# Patient Record
Sex: Male | Born: 1975 | Race: White | Hispanic: No | Marital: Married | Smoking: Never smoker
Health system: Southern US, Community
[De-identification: ages and names within clinical notes are randomized; demographics above are authoritative.]

## PROBLEM LIST (undated history)

## (undated) DIAGNOSIS — N200 Calculus of kidney: Secondary | ICD-10-CM

---

## 2003-12-15 ENCOUNTER — Emergency Department (HOSPITAL_COMMUNITY): Admission: EM | Admit: 2003-12-15 | Discharge: 2003-12-15 | Payer: Self-pay | Admitting: Emergency Medicine

## 2007-12-07 ENCOUNTER — Encounter: Admission: RE | Admit: 2007-12-07 | Discharge: 2007-12-07 | Payer: Self-pay | Admitting: Family Medicine

## 2008-04-10 IMAGING — CT CT HEAD W/O CM
1 series · 16 of 30 positions shown, 20 images · non-contrast
Comparison: none

CLINICAL DATA: Headaches

[Series 32: 3d filtered head · axial · 0.49mm/px · z∈[-9,+134]mm · 16 of 32 slices shown, 20 images]
[im 2/32  brain]
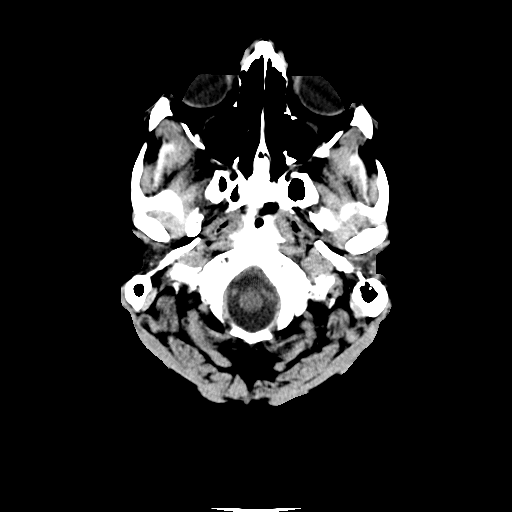
[im 2/32  bone]
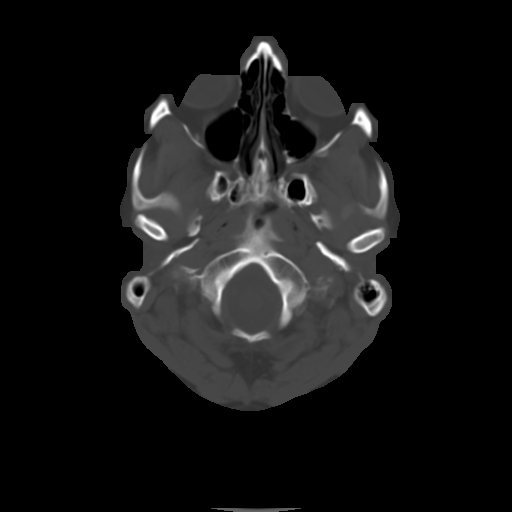
[im 4/32  brain]
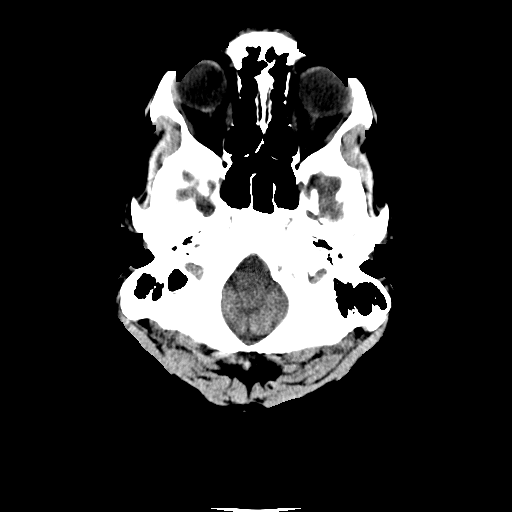
[im 6/32  brain]
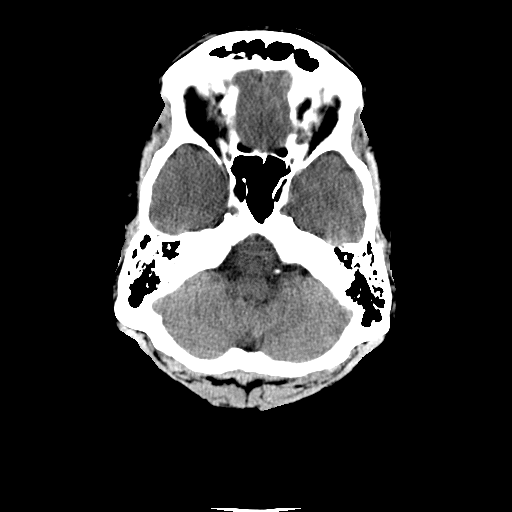
[im 8/32  brain]
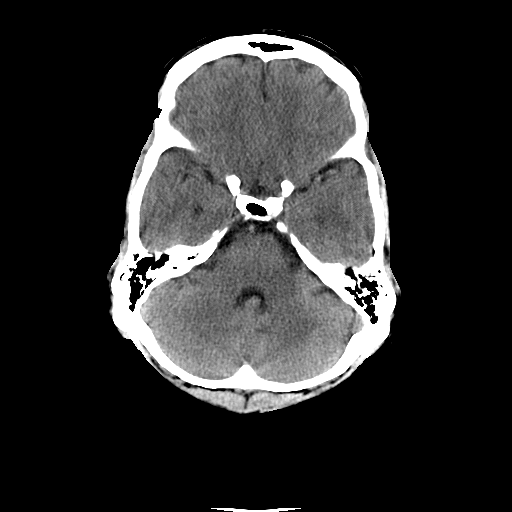
[im 9/32  brain]
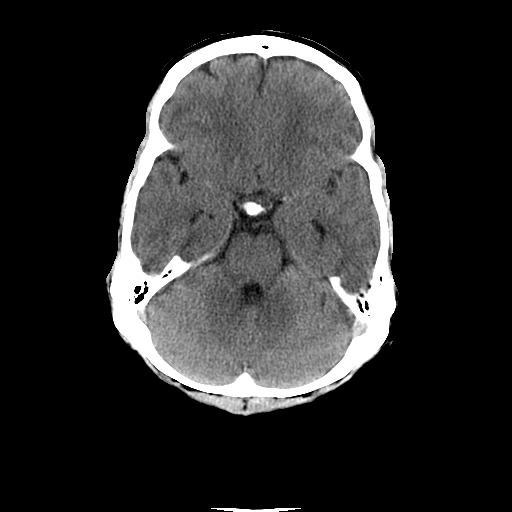
[im 9/32  bone]
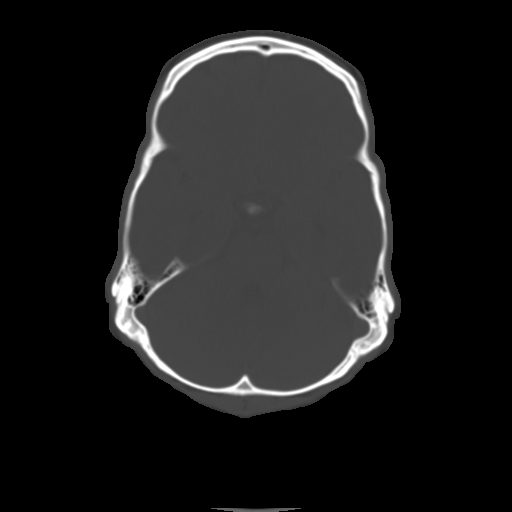
[im 11/32  brain]
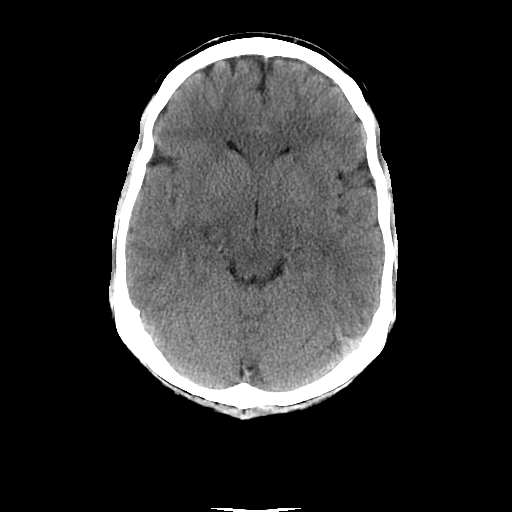
[im 13/32  brain]
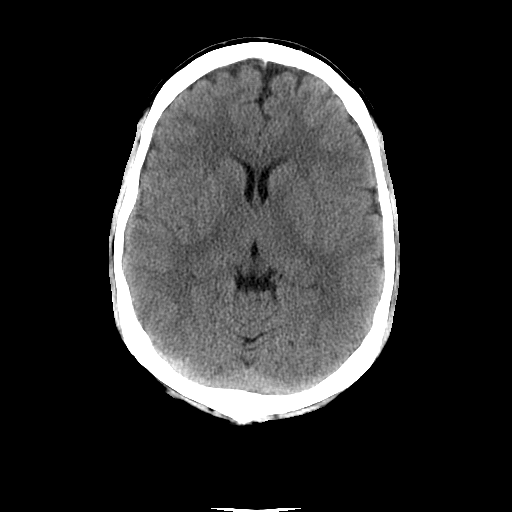
[im 15/32  brain]
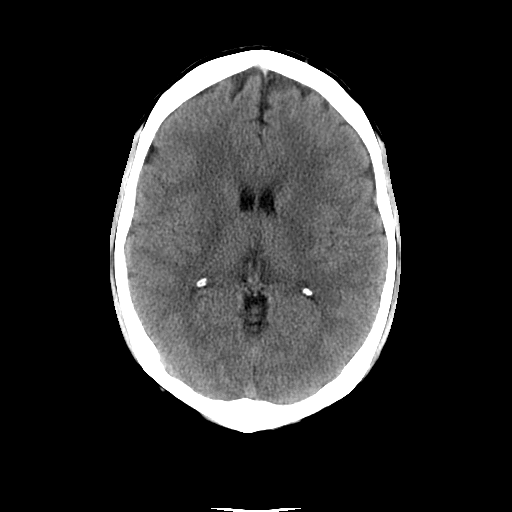
[im 17/32  brain]
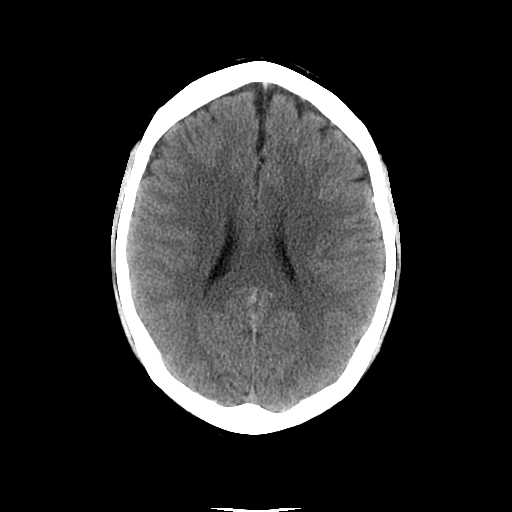
[im 17/32  bone]
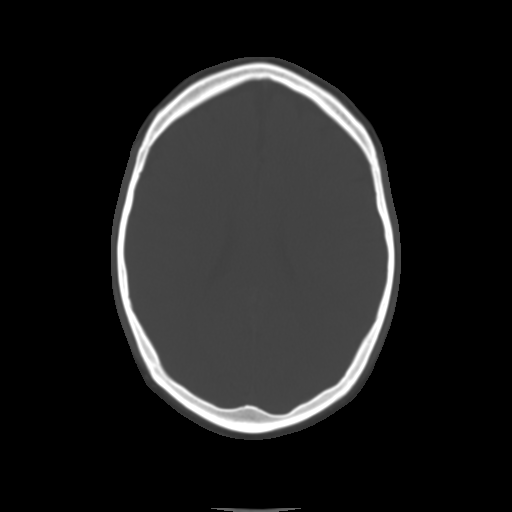
[im 19/32  brain]
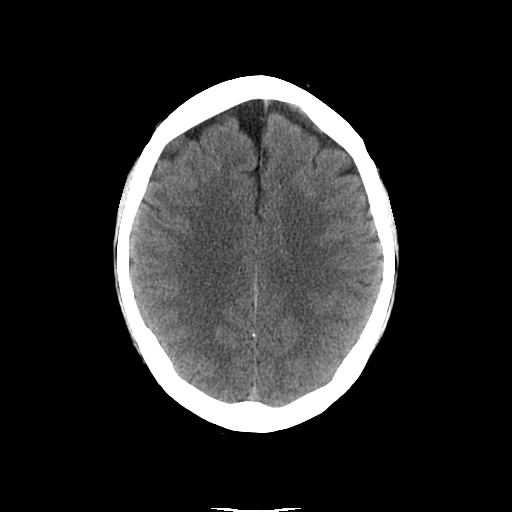
[im 21/32  brain]
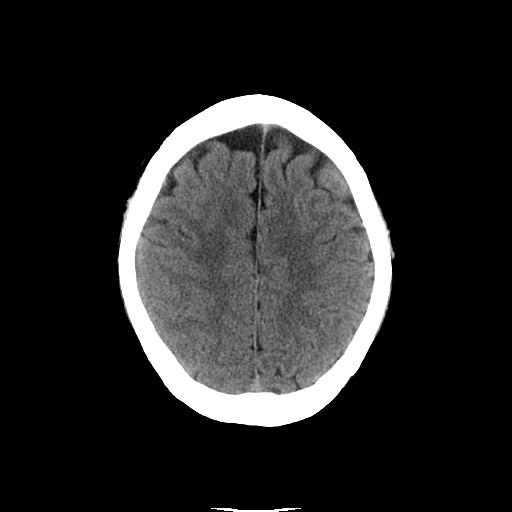
[im 23/32  brain]
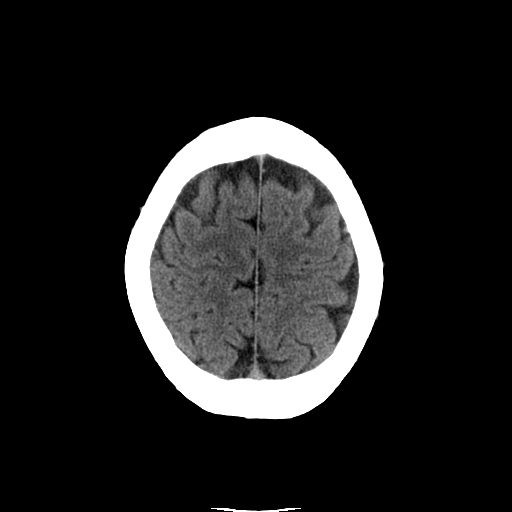
[im 24/32  brain]
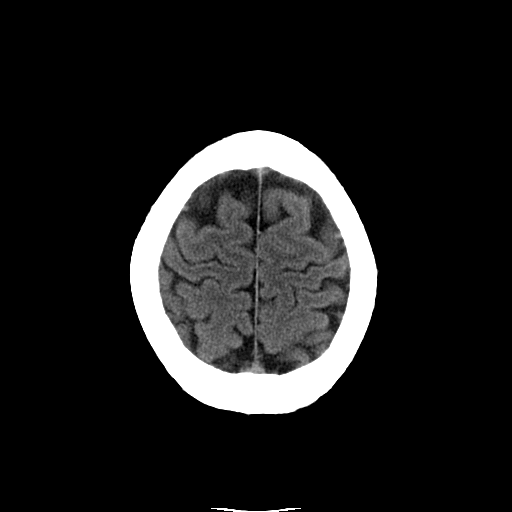
[im 24/32  bone]
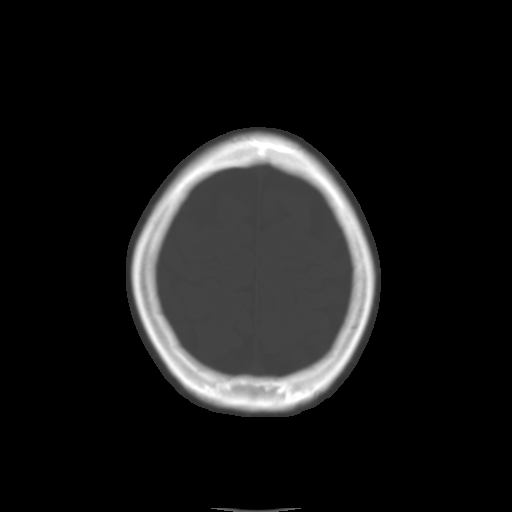
[im 26/32  brain]
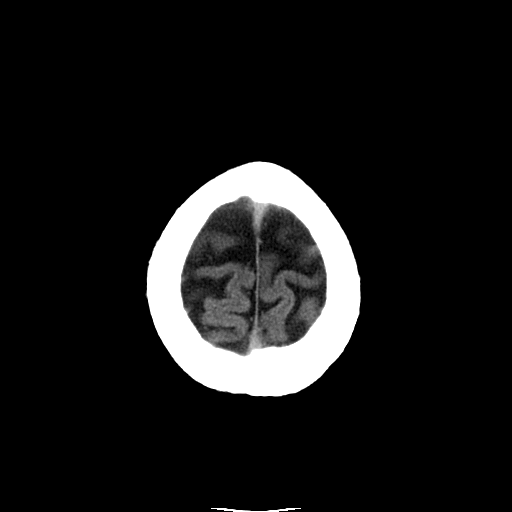
[im 28/32  brain]
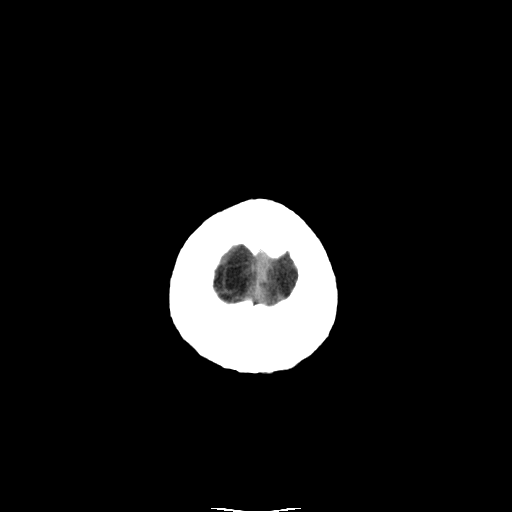
[im 30/32  brain]
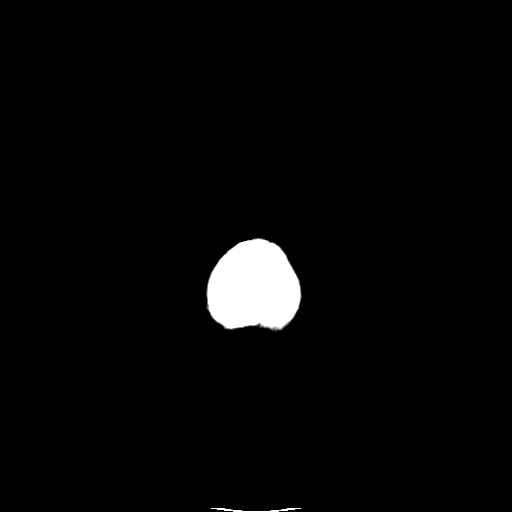

[16 of 30 positions shown; findings below may reference images not displayed]

CT head without contrast:

No previous for comparison.  There is no evidence of acute intracranial
hemorrhage, brain edema, mass,  mass effect, or midline shift. Acute infarct may
be inapparent on noncontrast CT.  No other intra-axial abnormalities are seen,
and the ventricles and sulci are within normal limits in size and symmetry.   No
abnormal extra-axial fluid collections or masses are identified.  No significant
calvarial abnormality.
IMPRESSION: 1. Negative non-contrast head CT.

## 2012-03-16 DIAGNOSIS — M549 Dorsalgia, unspecified: Secondary | ICD-10-CM | POA: Insufficient documentation

## 2013-02-07 ENCOUNTER — Telehealth: Payer: Self-pay | Admitting: Nurse Practitioner

## 2013-02-07 NOTE — Telephone Encounter (Signed)
appt made

## 2013-02-08 ENCOUNTER — Ambulatory Visit: Payer: Self-pay | Admitting: Family Medicine

## 2015-02-20 DIAGNOSIS — K2100 Gastro-esophageal reflux disease with esophagitis, without bleeding: Secondary | ICD-10-CM | POA: Insufficient documentation

## 2015-08-05 ENCOUNTER — Emergency Department (HOSPITAL_BASED_OUTPATIENT_CLINIC_OR_DEPARTMENT_OTHER)
Admission: EM | Admit: 2015-08-05 | Discharge: 2015-08-05 | Disposition: A | Discharge status: Home / Self Care | Payer: BLUE CROSS/BLUE SHIELD | Attending: Emergency Medicine | Admitting: Emergency Medicine

## 2015-08-05 ENCOUNTER — Encounter (HOSPITAL_BASED_OUTPATIENT_CLINIC_OR_DEPARTMENT_OTHER): Payer: Self-pay

## 2015-08-05 DIAGNOSIS — R109 Unspecified abdominal pain: Secondary | ICD-10-CM

## 2015-08-05 DIAGNOSIS — N200 Calculus of kidney: Secondary | ICD-10-CM | POA: Diagnosis not present

## 2015-08-05 DIAGNOSIS — R319 Hematuria, unspecified: Secondary | ICD-10-CM

## 2015-08-05 HISTORY — DX: Calculus of kidney: N20.0

## 2015-08-05 LAB — URINE MICROSCOPIC-ADD ON

## 2015-08-05 LAB — URINALYSIS, ROUTINE W REFLEX MICROSCOPIC
Bilirubin Urine: NEGATIVE
GLUCOSE, UA: NEGATIVE mg/dL
KETONES UR: 15 mg/dL — AB
Nitrite: NEGATIVE
PROTEIN: NEGATIVE mg/dL
Specific Gravity, Urine: 1.016 (ref 1.005–1.030)
Urobilinogen, UA: 1 mg/dL (ref 0.0–1.0)
pH: 6 (ref 5.0–8.0)

## 2015-08-05 MED ORDER — TAMSULOSIN HCL 0.4 MG PO CAPS: 0.4000 mg | ORAL_CAPSULE | Freq: Every day | ORAL | Status: DC

## 2015-08-05 MED ORDER — ONDANSETRON 4 MG PO TBDP: ORAL_TABLET | ORAL | Status: DC

## 2015-08-05 MED ORDER — ONDANSETRON 4 MG PO TBDP
4.0000 mg | ORAL_TABLET | Freq: Once | ORAL | Status: AC
  Administered 2015-08-05: 4 mg via ORAL
  Filled 2015-08-05: qty 1

## 2015-08-05 MED ORDER — OXYCODONE-ACETAMINOPHEN 5-325 MG PO TABS: 1.0000 | ORAL_TABLET | Freq: Four times a day (QID) | ORAL | Status: DC | PRN

## 2015-08-05 MED ORDER — HYDROMORPHONE HCL 1 MG/ML IJ SOLN
1.0000 mg | Freq: Once | INTRAMUSCULAR | Status: AC
  Administered 2015-08-05: 1 mg via INTRAMUSCULAR
  Filled 2015-08-05: qty 1

## 2015-08-05 NOTE — Discharge Instructions (Signed)
Take percocet for severe pain only. No driving or operating heavy machinery while taking percocet. This medication may cause drowsiness. Take Zofran as directed as needed for nausea. Take flomax as prescribed.  Flank Pain Flank pain refers to pain that is located on the side of the body between the upper abdomen and the back. The pain may occur over a short period of time (acute) or may be long-term or reoccurring (chronic). It may be mild or severe. Flank pain can be caused by many things. CAUSES  Some of the more common causes of flank pain include:  Muscle strains.   Muscle spasms.   A disease of your spine (vertebral disk disease).   A lung infection (pneumonia).   Fluid around your lungs (pulmonary edema).   A kidney infection.   Kidney stones.   A very painful skin rash caused by the chickenpox virus (shingles).   Gallbladder disease.  HOME CARE INSTRUCTIONS  Home care will depend on the cause of your pain. In general,  Rest as directed by your caregiver.  Drink enough fluids to keep your urine clear or pale yellow.  Only take over-the-counter or prescription medicines as directed by your caregiver. Some medicines may help relieve the pain.  Tell your caregiver about any changes in your pain.  Follow up with your caregiver as directed. SEEK IMMEDIATE MEDICAL CARE IF:   Your pain is not controlled with medicine.   You have new or worsening symptoms.  Your pain increases.   You have abdominal pain.   You have shortness of breath.   You have persistent nausea or vomiting.   You have swelling in your abdomen.   You feel faint or pass out.   You have blood in your urine.  You have a fever or persistent symptoms for more than 2-3 days.  You have a fever and your symptoms suddenly get worse. MAKE SURE YOU:   Understand these instructions.  Will watch your condition.  Will get help right away if you are not doing well or get worse.     This information is not intended to replace advice given to you by your health care provider. Make sure you discuss any questions you have with your health care provider.   Document Released: 11/25/2005 Document Revised: 06/28/2012 Document Reviewed: 05/18/2012 Elsevier Interactive Patient Education 2016 Else[employer]  Kidney Stones Kidney stones (urolithiasis) are deposits that form inside your kidneys. The intense pain is caused by the stone moving through the urinary tract. When the stone moves, the ureter goes into spasm around the stone. The stone is usually passed in the urine.  CAUSES   A disorder that makes certain neck glands produce too much parathyroid hormone (primary hyperparathyroidism).  A buildup of uric acid crystals, similar to gout in your joints.  Narrowing (stricture) of the ureter.  A kidney obstruction present at birth (congenital obstruction).  Previous surgery on the kidney or ureters.  Numerous kidney infections. SYMPTOMS   Feeling sick to your stomach (nauseous).  Throwing up (vomiting).  Blood in the urine (hematuria).  Pain that usually spreads (radiates) to the groin.  Frequency or urgency of urination. DIAGNOSIS   Taking a history and physical exam.  Blood or urine tests.  CT scan.  Occasionally, an examination of the inside of the urinary bladder (cystoscopy) is performed. TREATMENT   Observation.  Increasing your fluid intake.  Extracorporeal shock wave lithotripsy--This is a noninvasive procedure that uses shock waves to break up kidney stones.  Surgery may be needed if you have severe pain or persistent obstruction. There are various surgical procedures. Most of the procedures are performed with the use of small instruments. Only small incisions are needed to accommodate these instruments, so recovery time is minimized. The size, location, and chemical composition are all important variables that will determine the proper choice  of action for you. Talk to your health care provider to better understand your situation so that you will minimize the risk of injury to yourself and your kidney.  HOME CARE INSTRUCTIONS   Drink enough water and fluids to keep your urine clear or pale yellow. This will help you to pass the stone or stone fragments.  Strain all urine through the provided strainer. Keep all particulate matter and stones for your health care provider to see. The stone causing the pain may be as small as a grain of salt. It is very important to use the strainer each and every time you pass your urine. The collection of your stone will allow your health care provider to analyze it and verify that a stone has actually passed. The stone analysis will often identify what you can do to reduce the incidence of recurrences.  Only take over-the-counter or prescription medicines for pain, discomfort, or fever as directed by your health care provider.  Keep all follow-up visits as told by your health care provider. This is important.  Get follow-up X-rays if required. The absence of pain does not always mean that the stone has passed. It may have only stopped moving. If the urine remains completely obstructed, it can cause loss of kidney function or even complete destruction of the kidney. It is your responsibility to make sure X-rays and follow-ups are completed. Ultrasounds of the kidney can show blockages and the status of the kidney. Ultrasounds are not associated with any radiation and can be performed easily in a matter of minutes.  Make changes to your daily diet as told by your health care provider. You may be told to:  Limit the amount of salt that you eat.  Eat 5 or more servings of fruits and vegetables each day.  Limit the amount of meat, poultry, fish, and eggs that you eat.  Collect a 24-hour urine sample as told by your health care provider.You may need to collect another urine sample every 6-12 months. SEEK  MEDICAL CARE IF:  You experience pain that is progressive and unresponsive to any pain medicine you have been prescribed. SEEK IMMEDIATE MEDICAL CARE IF:   Pain cannot be controlled with the prescribed medicine.  You have a fever or shaking chills.  The severity or intensity of pain increases over 18 hours and is not relieved by pain medicine.  You develop a new onset of abdominal pain.  You feel faint or pass out.  You are unable to urinate.   This information is not intended to replace advice given to you by your health care provider. Make sure you discuss any questions you have with your health care provider.   Document Released: 10/04/2005 Document Revised: 06/25/2015 Document Reviewed: 03/07/2013 Elsevier Interactive Patient Education [employer]

## 2015-08-05 NOTE — ED Notes (Signed)
Small black object noted in urine cup.

## 2015-08-05 NOTE — ED Provider Notes (Signed)
CSN: [id number]     Arrival date & time 08/05/15  1230 History   First MD Initiated Contact with Patient 08/05/15 1240     Chief Complaint  Patient presents with  . Flank Pain     (Consider location/radiation/quality/duration/timing/severity/associated sxs/prior Treatment) HPI Comments: 39 year old male complaining of sudden onset left flank pain beginning at 3:30 AM that woke him up from sleep. Pain has been waxing and waning since, currently rated 7/10. Pain radiates from the left side of his back around to his flank area. He felt a kidney stone pass today but believes there is another one in there. Reports hematuria. Denies difficulty urinating, increased urinary frequency, urgency or dysuria. Denies fever or chills. Admits to nausea and vomiting. History of a kidney stone 11 years ago. No recent dietary changes. Also requesting his R ear checked. It has felt full over the past week with ringing.  Patient is a 39 y.o. male presenting with flank pain. The history is provided by the patient and the spouse.  Flank Pain This is a new problem. The current episode started today. The problem occurs constantly. The problem has been waxing and waning. Associated symptoms include nausea and vomiting. Nothing aggravates the symptoms. He has tried nothing for the symptoms.    Past Medical History  Diagnosis Date  . Kidney stone    History reviewed. No pertinent past surgical history. No family history on file. Social History  Substance Use Topics  . Smoking status: Never Smoker   . Smokeless tobacco: None  . Alcohol Use: No    Review of Systems  Gastrointestinal: Positive for nausea and vomiting.  Genitourinary: Positive for hematuria and flank pain.  All other systems reviewed and are negative.     Allergies  Review of patient's allergies indicates no known allergies.  Home Medications   Prior to Admission medications   Medication Sig Start Date End Date Taking? Authorizing  Provider  Omeprazole (PRILOSEC PO) Take by mouth.   Yes Historical Provider, MD  ondansetron (ZOFRAN ODT) 4 MG disintegrating tablet 4mg  ODT q4 hours prn nausea/vomit 08/05/15   Michiel Sivley M Yarelli Decelles, PA-C  oxyCODONE-acetaminophen (PERCOCET) 5-325 MG tablet Take 1-2 tablets by mouth every 6 (six) hours as needed for severe pain. 08/05/15   Kathrynn Speedobyn M Maddax Palinkas, PA-C  tamsulosin (FLOMAX) 0.4 MG CAPS capsule Take 1 capsule (0.4 mg total) by mouth daily. 08/05/15   Aryona Sill M Courtnei Ruddell, PA-C   BP 122/50 mmHg  Pulse 67  Temp(Src) 97.6 F (36.4 C) (Oral)  Resp 20  Ht 5\' 9"  (1.753 m)  Wt 185 lb (83.915 kg)  BMI 27.31 kg/m2  SpO2 100% Physical Exam  Constitutional: He is oriented to person, place, and time. He appears well-developed and well-nourished. No distress.  Uncomfortable but in no acute distress.  HENT:  Head: Normocephalic and atraumatic.  Right Ear: Tympanic membrane, external ear and ear canal normal.  Left Ear: Tympanic membrane, external ear and ear canal normal.  Eyes: Conjunctivae and EOM are normal. No scleral icterus.  Neck: Normal range of motion. Neck supple.  Cardiovascular: Normal rate, regular rhythm and normal heart sounds.   Pulmonary/Chest: Effort normal and breath sounds normal.  Abdominal: Normal appearance. He exhibits no distension. There is no tenderness. There is no CVA tenderness.  Musculoskeletal: Normal range of motion. He exhibits no edema.  Neurological: He is alert and oriented to person, place, and time.  Skin: Skin is warm and dry.  Psychiatric: He has a normal mood and affect. His  behavior is normal.  Nursing note and vitals reviewed.   ED Course  Procedures (including critical care time) Labs Review Labs Reviewed  URINALYSIS, ROUTINE W REFLEX MICROSCOPIC (NOT AT [facility]) - Abnormal; Notable for the following:    Color, Urine AMBER (*)    APPearance CLOUDY (*)    Hgb urine dipstick LARGE (*)    Ketones, ur 15 (*)    Leukocytes, UA TRACE (*)    All other components  within normal limits  URINE MICROSCOPIC-ADD ON - Abnormal; Notable for the following:    Squamous Epithelial / LPF FEW (*)    All other components within normal limits  URINE CULTURE    Imaging Review No results found. I have personally reviewed and evaluated these images and lab results as part of my medical decision-making.   EKG Interpretation None      MDM   Final diagnoses:  Left flank pain  Kidney stone on left side  Hematuria   Nontoxic appearing, uncomfortable but in no acute distress. AFVSS. History of kidney stones and this feels similar. UA significant for blood in urine without associated infection. Culture pending. This is most likely from the kidney stone he passed earlier today. Pain and nausea significantly improved in the ED. Discussed CT scan with patient, and I do not feel this is necessary given that he is having no difficulty urinating. Patient agreeable and would like to defer any imaging at this time. Will discharge home with Percocet, Zofran and Flomax and have him follow-up with urology. R ear normal. Stable for discharge. Return precautions given. Pt/family/caregiver aware medical decision making process and agreeable with plan.  Kathrynn Speed, PA-C 08/05/15 1343  Kathrynn Speed, PA-C 08/05/15 1406  Kathrynn Speed, PA-C 08/05/15 1407  Gwyneth Sprout, MD 08/06/15 1534

## 2015-08-05 NOTE — ED Notes (Signed)
Left flank pain x today-blood tinged blood this am

## 2015-08-06 LAB — URINE CULTURE: CULTURE: NO GROWTH

## 2016-03-22 DIAGNOSIS — Z8042 Family history of malignant neoplasm of prostate: Secondary | ICD-10-CM | POA: Insufficient documentation

## 2016-03-22 DIAGNOSIS — Z87442 Personal history of urinary calculi: Secondary | ICD-10-CM | POA: Insufficient documentation

## 2017-09-06 DIAGNOSIS — R400 Somnolence: Secondary | ICD-10-CM | POA: Insufficient documentation

## 2018-03-06 DIAGNOSIS — R748 Abnormal levels of other serum enzymes: Secondary | ICD-10-CM | POA: Insufficient documentation

## 2018-09-08 DIAGNOSIS — F419 Anxiety disorder, unspecified: Secondary | ICD-10-CM | POA: Insufficient documentation

## 2019-04-12 DIAGNOSIS — Z20828 Contact with and (suspected) exposure to other viral communicable diseases: Secondary | ICD-10-CM | POA: Diagnosis not present

## 2019-09-01 DIAGNOSIS — Z20828 Contact with and (suspected) exposure to other viral communicable diseases: Secondary | ICD-10-CM | POA: Diagnosis not present

## 2019-09-01 DIAGNOSIS — R0981 Nasal congestion: Secondary | ICD-10-CM | POA: Diagnosis not present

## 2019-09-12 DIAGNOSIS — K2100 Gastro-esophageal reflux disease with esophagitis, without bleeding: Secondary | ICD-10-CM | POA: Diagnosis not present

## 2019-09-12 DIAGNOSIS — R9431 Abnormal electrocardiogram [ECG] [EKG]: Secondary | ICD-10-CM | POA: Diagnosis not present

## 2019-09-12 DIAGNOSIS — F419 Anxiety disorder, unspecified: Secondary | ICD-10-CM | POA: Diagnosis not present

## 2019-09-12 DIAGNOSIS — Z23 Encounter for immunization: Secondary | ICD-10-CM | POA: Diagnosis not present

## 2019-09-12 DIAGNOSIS — R001 Bradycardia, unspecified: Secondary | ICD-10-CM | POA: Diagnosis not present

## 2019-09-12 DIAGNOSIS — Z0000 Encounter for general adult medical examination without abnormal findings: Secondary | ICD-10-CM | POA: Diagnosis not present

## 2019-09-12 DIAGNOSIS — E785 Hyperlipidemia, unspecified: Secondary | ICD-10-CM | POA: Diagnosis not present

## 2020-03-04 DIAGNOSIS — H00022 Hordeolum internum right lower eyelid: Secondary | ICD-10-CM | POA: Diagnosis not present

## 2020-08-26 ENCOUNTER — Encounter: Payer: Self-pay | Admitting: Nurse Practitioner

## 2020-08-26 ENCOUNTER — Other Ambulatory Visit: Payer: Self-pay

## 2020-08-26 ENCOUNTER — Ambulatory Visit (INDEPENDENT_AMBULATORY_CARE_PROVIDER_SITE_OTHER): Payer: BC Managed Care – PPO | Admitting: Nurse Practitioner

## 2020-08-26 VITALS — BP 106/70 | HR 46 | Temp 97.4°F | Ht 69.0 in | Wt 174.0 lb

## 2020-08-26 DIAGNOSIS — Z0000 Encounter for general adult medical examination without abnormal findings: Secondary | ICD-10-CM | POA: Diagnosis not present

## 2020-08-26 DIAGNOSIS — E785 Hyperlipidemia, unspecified: Secondary | ICD-10-CM | POA: Diagnosis not present

## 2020-08-26 LAB — CBC WITH DIFFERENTIAL/PLATELET
Basophils Absolute: 0 10*3/uL (ref 0.0–0.2)
Basos: 1 %
EOS (ABSOLUTE): 0.1 10*3/uL (ref 0.0–0.4)
Eos: 1 %
Hematocrit: 47.8 % (ref 37.5–51.0)
Hemoglobin: 16.3 g/dL (ref 13.0–17.7)
Immature Grans (Abs): 0 10*3/uL (ref 0.0–0.1)
Immature Granulocytes: 0 %
Lymphocytes Absolute: 1.4 10*3/uL (ref 0.7–3.1)
Lymphs: 28 %
MCH: 30.2 pg (ref 26.6–33.0)
MCHC: 34.1 g/dL (ref 31.5–35.7)
MCV: 89 fL (ref 79–97)
Monocytes Absolute: 0.4 10*3/uL (ref 0.1–0.9)
Monocytes: 8 %
Neutrophils Absolute: 3.2 10*3/uL (ref 1.4–7.0)
Neutrophils: 62 %
Platelets: 301 10*3/uL (ref 150–450)
RBC: 5.39 x10E6/uL (ref 4.14–5.80)
RDW: 12.4 % (ref 11.6–15.4)
WBC: 5.1 10*3/uL (ref 3.4–10.8)

## 2020-08-26 LAB — COMPREHENSIVE METABOLIC PANEL
ALT: 23 IU/L (ref 0–44)
AST: 21 IU/L (ref 0–40)
Albumin/Globulin Ratio: 2.3 — ABNORMAL HIGH (ref 1.2–2.2)
Albumin: 4.8 g/dL (ref 4.0–5.0)
Alkaline Phosphatase: 88 IU/L (ref 44–121)
BUN/Creatinine Ratio: 11 (ref 9–20)
BUN: 12 mg/dL (ref 6–24)
Bilirubin Total: 0.9 mg/dL (ref 0.0–1.2)
CO2: 24 mmol/L (ref 20–29)
Calcium: 10.2 mg/dL (ref 8.7–10.2)
Chloride: 105 mmol/L (ref 96–106)
Creatinine, Ser: 1.08 mg/dL (ref 0.76–1.27)
GFR calc Af Amer: 95 mL/min/{1.73_m2} (ref >59)
GFR calc non Af Amer: 82 mL/min/{1.73_m2} (ref >59)
Globulin, Total: 2.1 g/dL (ref 1.5–4.5)
Glucose: 85 mg/dL (ref 65–99)
Potassium: 4.5 mmol/L (ref 3.5–5.2)
Sodium: 133 mmol/L — ABNORMAL LOW (ref 134–144)
Total Protein: 6.9 g/dL (ref 6.0–8.5)

## 2020-08-26 LAB — LIPID PANEL
Chol/HDL Ratio: 3.9 ratio (ref 0.0–5.0)
Cholesterol, Total: 216 mg/dL — ABNORMAL HIGH (ref 100–199)
HDL: 55 mg/dL (ref >39)
LDL Chol Calc (NIH): 147 mg/dL — ABNORMAL HIGH (ref 0–99)
Triglycerides: 77 mg/dL (ref 0–149)
VLDL Cholesterol Cal: 14 mg/dL (ref 5–40)

## 2020-08-26 NOTE — Progress Notes (Signed)
New Patient Note  RE: BRONX BROGDEN MRN: [id number] DOB: 04/08/1976 Date of Office Visit: 08/26/2020  Chief Complaint: Annual Exam  History of Present Illness:    Encounter for general adult medical examination without abnormal findings  Physical: Patient's last physical exam was 1 year ago .  Weight: Appropriate for height (BMI less than 27%) ;  Blood Pressure: Normal (BP less than 120/80) ;  Medical History: Patient history reviewed ; Family history reviewed ;  Allergies Reviewed: No change in current allergies ;  Medications Reviewed: Medications reviewed - no changes ;  Lipids: Normal lipid levels ;  Smoking: Life-long non-smoker ;  Physical Activity: Exercises at least 3 times per week ;  Alcohol/Drug Use: Is a non-drinker ; No illicit drug use ;  Patient is not afflicted from Stress Incontinence and Urge Incontinence  Safety: reviewed ; Patient wears a seat belt, has smoke detectors, has carbon monoxide detectors, practices appropriate gun safety, and wears sunscreen with extended sun exposure. Dental Care: biannual cleanings, brushes and flosses daily. Ophthalmology/Optometry: Annual visit.  Hearing loss: none Vision impairments: none  Assessment and Plan: Corey Rosario is a 44 y.o. male with: No problem-specific Assessment & Plan notes found for this encounter.  Return in about 1 year (around 08/26/2021).   Diagnostics:   Past Medical History: Patient Active Problem List   Diagnosis Date Noted  . Annual physical exam 08/26/2020   Past Medical History:  Diagnosis Date  . Kidney stone    Past Surgical History: History reviewed. No pertinent surgical history. Medication List:  No current outpatient medications on file.   No current facility-administered medications for this visit.   Allergies: No Known Allergies Social History: Social History   Socioeconomic History  . Marital status: Married    Spouse name: Not on file  . Number of children: Not on  file  . Years of education: Not on file  . Highest education level: Not on file  Occupational History  . Not on file  Tobacco Use  . Smoking status: Never Smoker  . Smokeless tobacco: Never Used  Substance and Sexual Activity  . Alcohol use: No  . Drug use: No  . Sexual activity: Not on file  Other Topics Concern  . Not on file  Social History Narrative  . Not on file   Social Determinants of Health   Financial Resource Strain:   . Difficulty of Paying Living Expenses: Not on file  Food Insecurity:   . Worried About Programme researcher, broadcasting/film/video in the Last Year: Not on file  . Ran Out of Food in the Last Year: Not on file  Transportation Needs:   . Lack of Transportation (Medical): Not on file  . Lack of Transportation (Non-Medical): Not on file  Physical Activity:   . Days of Exercise per Week: Not on file  . Minutes of Exercise per Session: Not on file  Stress:   . Feeling of Stress : Not on file  Social Connections:   . Frequency of Communication with Friends and Family: Not on file  . Frequency of Social Gatherings with Friends and Family: Not on file  . Attends Religious Services: Not on file  . Active Member of Clubs or Organizations: Not on file  . Attends Banker Meetings: Not on file  . Marital Status: Not on file       Family History: Family History  Problem Relation Age of Onset  . Hyperlipidemia Mother   . Diabetes Father   .  Hyperlipidemia Father          Review of Systems  Constitutional: Negative.   HENT: Negative.   Eyes: Negative.   Respiratory: Negative.   Cardiovascular: Negative.   Gastrointestinal: Negative.   Genitourinary: Negative.   Musculoskeletal: Negative.   Skin: Negative.   Neurological: Negative.   Psychiatric/Behavioral: Negative.   All other systems reviewed and are negative.  Objective: BP 106/70   Pulse (!) 46   Temp (!) 97.4 F (36.3 C) (Temporal)   Ht 5\' 9"  (1.753 m)   Wt 174 lb (78.9 kg)   SpO2 99%    BMI 25.70 kg/m  Body mass index is 25.7 kg/m. Physical Exam Vitals reviewed.  Constitutional:      Appearance: Normal appearance.  HENT:     Head: Normocephalic.     Right Ear: External ear normal. There is no impacted cerumen.     Left Ear: External ear normal. There is no impacted cerumen.     Nose: Nose normal.     Mouth/Throat:     Mouth: Mucous membranes are moist.     Pharynx: Oropharynx is clear. No oropharyngeal exudate.  Eyes:     Conjunctiva/sclera: Conjunctivae normal.  Cardiovascular:     Rate and Rhythm: Normal rate.     Pulses: Normal pulses.     Heart sounds: Normal heart sounds.  Pulmonary:     Effort: Pulmonary effort is normal.     Breath sounds: Normal breath sounds.  Abdominal:     General: Bowel sounds are normal.  Musculoskeletal:        General: No tenderness. Normal range of motion.     Cervical back: Normal range of motion.  Skin:    General: Skin is warm.  Neurological:     Mental Status: He is alert and oriented to person, place, and time.  Psychiatric:        Mood and Affect: Mood normal.        Behavior: Behavior normal.    The plan was reviewed with the patient/family, and all questions/concerned were addressed.  It was my pleasure to see Corey Rosario today and participate in his care. Please feel free to contact me with any questions or concerns.  Sincerely,  Sharlet Salina NP [facility]

## 2020-08-26 NOTE — Patient Instructions (Signed)

## 2020-08-26 NOTE — Assessment & Plan Note (Signed)
Patient in clinic today for an annual  Physical exam, completed head to toe assessment, patient has no new concerns, provided education to patient for preventative and health maintenance.  Printed handout given.   Follow up in 1 year.

## 2020-11-07 ENCOUNTER — Encounter: Payer: Self-pay | Admitting: Family Medicine

## 2020-11-07 ENCOUNTER — Ambulatory Visit (INDEPENDENT_AMBULATORY_CARE_PROVIDER_SITE_OTHER): Payer: BC Managed Care – PPO | Admitting: Family Medicine

## 2020-11-07 DIAGNOSIS — J019 Acute sinusitis, unspecified: Secondary | ICD-10-CM

## 2020-11-07 MED ORDER — AMOXICILLIN-POT CLAVULANATE 875-125 MG PO TABS: 1.0000 | ORAL_TABLET | Freq: Two times a day (BID) | ORAL | 0 refills | Status: AC

## 2020-11-07 MED ORDER — FLUTICASONE PROPIONATE 50 MCG/ACT NA SUSP: 2.0000 | Freq: Every day | NASAL | 6 refills | Status: AC

## 2020-11-07 MED ORDER — BENZONATATE 100 MG PO CAPS: 100.0000 mg | ORAL_CAPSULE | Freq: Three times a day (TID) | ORAL | 0 refills | Status: AC | PRN

## 2020-11-07 NOTE — Progress Notes (Signed)
   Virtual Visit via telephone Note Due to COVID-19 pandemic this visit was conducted virtually. This visit type was conducted due to national recommendations for restrictions regarding the COVID-19 Pandemic (e.g. social distancing, sheltering in place) in an effort to limit this patient's exposure and mitigate transmission in our community. All issues noted in this document were discussed and addressed.  A physical exam was not performed with this format.  I connected with Corey Rosario on 11/07/20 at 1050 by telephone and verified that I am speaking with the correct person using two identifiers. Corey Rosario is currently located at work and no one is currently with him during the visit. The provider, Gabriel Earing, FNP is located in their office at time of visit.  I discussed the limitations, risks, security and privacy concerns of performing an evaluation and management service by telephone and the availability of in person appointments. I also discussed with the patient that there may be a patient responsible charge related to this service. The patient expressed understanding and agreed to proceed.  CC: congestion  History and Present Illness:  HPI  Corey Rosario reports cough and congestion since testing positive for Covid on 10/22/20. He has taken mucinex, sudafed, and nasal saline without improvement. He reports feeling fine other than the cough and congestion. He reports that his cough and congestion has not improved despite OTC treatments. His cough is now productive a times with yellow mucus. He denies fever, chest pain, wheezing, or shortness of breath.     ROS As per HPI.   Observations/Objective: Alert and oriented x 3. Able to speak in full sentences without difficulty.   Assessment and Plan: Diagnoses and all orders for this visit:  Acute non-recurrent sinusitis, unspecified location Will treat with antibiotics given length of symptoms without improvement in  congestion. Tessalon pearles for cough. Continue nasal saline. Add flonase daily. Push fluids. Return to office for new or worsening symptoms, or if symptoms persist.  -     amoxicillin-clavulanate (AUGMENTIN) 875-125 MG tablet; Take 1 tablet by mouth 2 (two) times daily. -     benzonatate (TESSALON PERLES) 100 MG capsule; Take 1 capsule (100 mg total) by mouth 3 (three) times daily as needed for cough. -     fluticasone (FLONASE) 50 MCG/ACT nasal spray; Place 2 sprays into both nostrils daily.   Follow Up Instructions: As needed.     I discussed the assessment and treatment plan with the patient. The patient was provided an opportunity to ask questions and all were answered. The patient agreed with the plan and demonstrated an understanding of the instructions.   The patient was advised to call back or seek an in-person evaluation if the symptoms worsen or if the condition fails to improve as anticipated.  The above assessment and management plan was discussed with the patient. The patient verbalized understanding of and has agreed to the management plan. Patient is aware to call the clinic if symptoms persist or worsen. Patient is aware when to return to the clinic for a follow-up visit. Patient educated on when it is appropriate to go to the emergency department.   Time call ended:  15   Corey Haynesworth Hughes Better, FNP

## 2020-12-02 DIAGNOSIS — Z1211 Encounter for screening for malignant neoplasm of colon: Secondary | ICD-10-CM | POA: Diagnosis not present

## 2020-12-02 DIAGNOSIS — Z0000 Encounter for general adult medical examination without abnormal findings: Secondary | ICD-10-CM | POA: Diagnosis not present

## 2020-12-02 DIAGNOSIS — M5136 Other intervertebral disc degeneration, lumbar region: Secondary | ICD-10-CM | POA: Diagnosis not present

## 2020-12-02 DIAGNOSIS — E291 Testicular hypofunction: Secondary | ICD-10-CM | POA: Diagnosis not present

## 2020-12-02 DIAGNOSIS — E785 Hyperlipidemia, unspecified: Secondary | ICD-10-CM | POA: Diagnosis not present

## 2020-12-15 DIAGNOSIS — E291 Testicular hypofunction: Secondary | ICD-10-CM | POA: Diagnosis not present

## 2020-12-15 DIAGNOSIS — R41840 Attention and concentration deficit: Secondary | ICD-10-CM | POA: Diagnosis not present

## 2021-04-21 ENCOUNTER — Ambulatory Visit (INDEPENDENT_AMBULATORY_CARE_PROVIDER_SITE_OTHER): Payer: BC Managed Care – PPO | Admitting: Podiatry

## 2021-04-21 ENCOUNTER — Other Ambulatory Visit: Payer: Self-pay

## 2021-04-21 DIAGNOSIS — L603 Nail dystrophy: Secondary | ICD-10-CM | POA: Diagnosis not present

## 2021-04-21 NOTE — Patient Instructions (Signed)
Look on [employer] for a urea cream or gel containing 40% or 50% urea. Something such as:     

## 2021-04-21 NOTE — Progress Notes (Signed)
  Subjective:  Patient ID: SAEED TOREN, male    DOB: 17-Sep-1976,  MRN: [id number]  Chief Complaint  Patient presents with   Nail Problem    B/L discoloration of nails. Right hallux thickened and only grows out to certain point. Has tried OTC fungal/remedies w/ no relief.    45 y.o. male presents with the above complaint. History confirmed with patient.  Objective:  Physical Exam: warm, good capillary refill, no trophic changes or ulcerative lesions, normal DP and PT pulses, and normal sensory exam. Left Foot: hallux nail thickened, dystrophic, lysis distally. 2nd toenail distal lysis Right Foot: distal hallux nail lysis, adhered proximally   Assessment:   1. Nail dystrophy    Plan:  Patient was evaluated and treated and all questions answered.  Onychodystrophy -Educated on etiology -Nails debrided of loose nail back to healthy growing nail. -Recc urea cream/gel OTC. Educated on use. Given instructions on where to get.  Return in about 6 weeks (around 06/02/2021) for Nail dystrophy f/u.

## 2021-05-25 DIAGNOSIS — Z23 Encounter for immunization: Secondary | ICD-10-CM | POA: Diagnosis not present

## 2021-05-25 DIAGNOSIS — T63441A Toxic effect of venom of bees, accidental (unintentional), initial encounter: Secondary | ICD-10-CM | POA: Diagnosis not present

## 2021-06-04 DIAGNOSIS — E785 Hyperlipidemia, unspecified: Secondary | ICD-10-CM | POA: Diagnosis not present

## 2021-06-09 ENCOUNTER — Other Ambulatory Visit: Payer: Self-pay

## 2021-06-09 ENCOUNTER — Ambulatory Visit (INDEPENDENT_AMBULATORY_CARE_PROVIDER_SITE_OTHER): Payer: BC Managed Care – PPO | Admitting: Podiatry

## 2021-06-09 DIAGNOSIS — L603 Nail dystrophy: Secondary | ICD-10-CM | POA: Diagnosis not present

## 2021-06-09 NOTE — Progress Notes (Signed)
  Subjective:  Patient ID: GORJE IYER, male    DOB: 1976-02-17,  MRN: [id number]  Chief Complaint  Patient presents with   Nail Problem    Right hallux nail follow up  PT stated that he is doing well he has no concerns at this time    45 y.o. male presents with the above complaint. History confirmed with patient.  Objective:  Physical Exam: warm, good capillary refill, no trophic changes or ulcerative lesions, normal DP and PT pulses, and normal sensory exam. Left Foot: hallux nail thin normal coloration. Right Foot: nail growing without further lysis Assessment:   1. Nail dystrophy     Plan:  Patient was evaluated and treated and all questions answered.  Onychodystrophy -Left hallux nail improved texture and color. Right hallux nail healing well without further lysis. Mild thickening. Debrided of thickness today. Continue urea cream 2-3x weekly. Should grow out normally within 3-4 months f/u if issues persist.  No follow-ups on file.

## 2021-08-10 DIAGNOSIS — H00021 Hordeolum internum right upper eyelid: Secondary | ICD-10-CM | POA: Diagnosis not present

## 2021-09-11 DIAGNOSIS — Z03818 Encounter for observation for suspected exposure to other biological agents ruled out: Secondary | ICD-10-CM | POA: Diagnosis not present

## 2021-09-11 DIAGNOSIS — R059 Cough, unspecified: Secondary | ICD-10-CM | POA: Diagnosis not present

## 2021-09-14 DIAGNOSIS — J329 Chronic sinusitis, unspecified: Secondary | ICD-10-CM | POA: Diagnosis not present

## 2021-09-14 DIAGNOSIS — J40 Bronchitis, not specified as acute or chronic: Secondary | ICD-10-CM | POA: Diagnosis not present

## 2021-11-29 ENCOUNTER — Emergency Department (HOSPITAL_BASED_OUTPATIENT_CLINIC_OR_DEPARTMENT_OTHER)
Admission: EM | Admit: 2021-11-29 | Discharge: 2021-11-29 | Disposition: A | Discharge status: Home / Self Care | Payer: BC Managed Care – PPO | Attending: Emergency Medicine | Admitting: Emergency Medicine

## 2021-11-29 ENCOUNTER — Emergency Department (HOSPITAL_BASED_OUTPATIENT_CLINIC_OR_DEPARTMENT_OTHER): Payer: BC Managed Care – PPO

## 2021-11-29 ENCOUNTER — Encounter (HOSPITAL_BASED_OUTPATIENT_CLINIC_OR_DEPARTMENT_OTHER): Payer: Self-pay | Admitting: Emergency Medicine

## 2021-11-29 ENCOUNTER — Other Ambulatory Visit: Payer: Self-pay

## 2021-11-29 DIAGNOSIS — R109 Unspecified abdominal pain: Secondary | ICD-10-CM | POA: Diagnosis present

## 2021-11-29 DIAGNOSIS — N200 Calculus of kidney: Secondary | ICD-10-CM

## 2021-11-29 DIAGNOSIS — Z7951 Long term (current) use of inhaled steroids: Secondary | ICD-10-CM | POA: Diagnosis not present

## 2021-11-29 DIAGNOSIS — N132 Hydronephrosis with renal and ureteral calculous obstruction: Secondary | ICD-10-CM | POA: Diagnosis not present

## 2021-11-29 LAB — COMPREHENSIVE METABOLIC PANEL
ALT: 40 U/L (ref 0–44)
AST: 27 U/L (ref 15–41)
Albumin: 4.4 g/dL (ref 3.5–5.0)
Alkaline Phosphatase: 68 U/L (ref 38–126)
Anion gap: 9 (ref 5–15)
BUN: 10 mg/dL (ref 6–20)
CO2: 24 mmol/L (ref 22–32)
Calcium: 9.3 mg/dL (ref 8.9–10.3)
Chloride: 106 mmol/L (ref 98–111)
Creatinine, Ser: 1.19 mg/dL (ref 0.61–1.24)
GFR, Estimated: >60 mL/min (ref >60)
Glucose, Bld: 128 mg/dL — ABNORMAL HIGH (ref 70–99)
Potassium: 4.3 mmol/L (ref 3.5–5.1)
Sodium: 139 mmol/L (ref 135–145)
Total Bilirubin: 1 mg/dL (ref 0.3–1.2)
Total Protein: 7 g/dL (ref 6.5–8.1)

## 2021-11-29 LAB — CBC WITH DIFFERENTIAL/PLATELET
Abs Immature Granulocytes: 0.04 10*3/uL (ref 0.00–0.07)
Basophils Absolute: 0 10*3/uL (ref 0.0–0.1)
Basophils Relative: 0 %
Eosinophils Absolute: 0 10*3/uL (ref 0.0–0.5)
Eosinophils Relative: 0 %
HCT: 44.2 % (ref 39.0–52.0)
Hemoglobin: 16 g/dL (ref 13.0–17.0)
Immature Granulocytes: 0 %
Lymphocytes Relative: 6 %
Lymphs Abs: 0.7 10*3/uL (ref 0.7–4.0)
MCH: 30.9 pg (ref 26.0–34.0)
MCHC: 36.2 g/dL — ABNORMAL HIGH (ref 30.0–36.0)
MCV: 85.3 fL (ref 80.0–100.0)
Monocytes Absolute: 0.3 10*3/uL (ref 0.1–1.0)
Monocytes Relative: 2 %
Neutro Abs: 10.4 10*3/uL — ABNORMAL HIGH (ref 1.7–7.7)
Neutrophils Relative %: 92 %
Platelets: 286 10*3/uL (ref 150–400)
RBC: 5.18 MIL/uL (ref 4.22–5.81)
RDW: 12 % (ref 11.5–15.5)
WBC: 11.5 10*3/uL — ABNORMAL HIGH (ref 4.0–10.5)
nRBC: 0 % (ref 0.0–0.2)

## 2021-11-29 LAB — URINALYSIS, MICROSCOPIC (REFLEX): RBC / HPF: >50 RBC/hpf (ref 0–5)

## 2021-11-29 LAB — URINALYSIS, ROUTINE W REFLEX MICROSCOPIC
Bilirubin Urine: NEGATIVE
Glucose, UA: NEGATIVE mg/dL
Ketones, ur: NEGATIVE mg/dL
Leukocytes,Ua: NEGATIVE
Nitrite: NEGATIVE
Protein, ur: NEGATIVE mg/dL
Specific Gravity, Urine: 1.02 (ref 1.005–1.030)
pH: 8.5 — ABNORMAL HIGH (ref 5.0–8.0)

## 2021-11-29 MED ORDER — OXYCODONE-ACETAMINOPHEN 5-325 MG PO TABS: 1.0000 | ORAL_TABLET | ORAL | 0 refills | Status: AC | PRN

## 2021-11-29 MED ORDER — MORPHINE SULFATE (PF) 4 MG/ML IV SOLN
4.0000 mg | Freq: Once | INTRAVENOUS | Status: AC
  Administered 2021-11-29: 4 mg via INTRAVENOUS
  Filled 2021-11-29: qty 1

## 2021-11-29 MED ORDER — KETOROLAC TROMETHAMINE 30 MG/ML IJ SOLN
30.0000 mg | Freq: Once | INTRAMUSCULAR | Status: AC
Start: 2021-11-29 — End: 2021-11-29
  Administered 2021-11-29: 30 mg via INTRAVENOUS
  Filled 2021-11-29: qty 1

## 2021-11-29 MED ORDER — PHENAZOPYRIDINE HCL 200 MG PO TABS: 200.0000 mg | ORAL_TABLET | Freq: Three times a day (TID) | ORAL | 0 refills | Status: AC | PRN

## 2021-11-29 MED ORDER — TAMSULOSIN HCL 0.4 MG PO CAPS: 0.4000 mg | ORAL_CAPSULE | Freq: Every day | ORAL | 0 refills | Status: AC

## 2021-11-29 MED ORDER — ONDANSETRON HCL 4 MG/2ML IJ SOLN
4.0000 mg | Freq: Once | INTRAMUSCULAR | Status: AC
  Administered 2021-11-29: 4 mg via INTRAVENOUS
  Filled 2021-11-29: qty 2

## 2021-11-29 MED ORDER — SODIUM CHLORIDE 0.9 % IV BOLUS
1000.0000 mL | Freq: Once | INTRAVENOUS | Status: AC
  Administered 2021-11-29: 1000 mL via INTRAVENOUS

## 2021-11-29 MED ORDER — ONDANSETRON HCL 4 MG PO TABS: 4.0000 mg | ORAL_TABLET | Freq: Three times a day (TID) | ORAL | 0 refills | Status: AC | PRN

## 2021-11-29 NOTE — ED Notes (Signed)
Patient transported to CT 

## 2021-11-29 NOTE — ED Provider Notes (Signed)
[facility] Provider Note   CSN: [id number] Arrival date & time: 11/29/21  1128     History  Chief Complaint  Patient presents with   Flank Pain    NOLE MONAHAN is a 46 y.o. male.  46 yo man presents with intense left-sided flank pain since last night. He has a history of kidney stones, last one 4-5 years ago. He has some nausea and vomiting, no fever, flank pain that wraps around to suprapubic area. No gross hematuria by report. He denies diarrhea, passing flatus, no history of abdominal surgeries.       Home Medications Prior to Admission medications   Medication Sig Start Date End Date Taking? Authorizing Provider  amoxicillin-clavulanate (AUGMENTIN) 875-125 MG tablet Take 1 tablet by mouth 2 (two) times daily. 11/07/20   Gwenlyn Perking, FNP  amphetamine-dextroamphetamine (ADDERALL) 10 MG tablet Take 10 mg by mouth 2 (two) times daily. 03/19/21   [provider]  benzonatate (TESSALON PERLES) 100 MG capsule Take 1 capsule (100 mg total) by mouth 3 (three) times daily as needed for cough. 11/07/20   Gwenlyn Perking, FNP  fluticasone (FLONASE) 50 MCG/ACT nasal spray Place 2 sprays into both nostrils daily. 11/07/20   Gwenlyn Perking, FNP  omeprazole (PRILOSEC) 10 MG capsule Take by mouth.    [provider]  ondansetron (ZOFRAN) 4 MG tablet Take 1 tablet (4 mg total) by mouth every 8 (eight) hours as needed for nausea or vomiting. 11/29/21  Yes Gladys Damme, MD  oxyCODONE-acetaminophen (PERCOCET/ROXICET) 5-325 MG tablet Take 1 tablet by mouth every 4 (four) hours as needed for severe pain. 11/29/21  Yes Gladys Damme, MD  phenazopyridine (PYRIDIUM) 200 MG tablet Take 1 tablet (200 mg total) by mouth 3 (three) times daily as needed for pain. 11/29/21  Yes Gladys Damme, MD  tamsulosin (FLOMAX) 0.4 MG CAPS capsule Take 1 capsule (0.4 mg total) by mouth daily. 11/29/21  Yes Gladys Damme, MD  valACYclovir (VALTREX) 1000  MG tablet TAKE 2 TABLETS(2000 MG) BY MOUTH TWICE DAILY FOR 1 DAY 08/04/20   [provider]      Allergies    Patient has no known allergies.    Review of Systems   Review of Systems  Constitutional:  Negative for appetite change, chills and fever.  Gastrointestinal:  Positive for abdominal pain, nausea and vomiting. Negative for constipation and diarrhea.  Genitourinary:  Positive for flank pain. Negative for dysuria, frequency and hematuria.  All other systems reviewed and are negative.  Physical Exam Updated Vital Signs BP 114/71    Pulse (!) 55    Temp 97.6 F (36.4 C) (Oral)    Resp 18    SpO2 95%  Physical Exam Constitutional:      General: He is in acute distress.     Appearance: Normal appearance. He is normal weight. He is ill-appearing. He is not toxic-appearing or diaphoretic.     Comments: Writhing in pain  HENT:     Head: Normocephalic and atraumatic.  Cardiovascular:     Rate and Rhythm: Normal rate and regular rhythm.     Pulses: Normal pulses.     Heart sounds: Normal heart sounds.  Pulmonary:     Effort: Pulmonary effort is normal.     Breath sounds: Normal breath sounds.  Abdominal:     General: Abdomen is flat. There is no distension.     Palpations: Abdomen is soft. There is no mass.  Tenderness: There is abdominal tenderness. There is left CVA tenderness and guarding. There is no right CVA tenderness or rebound.  Musculoskeletal:     Right lower leg: No edema.     Left lower leg: No edema.  Skin:    General: Skin is warm and dry.     Capillary Refill: Capillary refill takes less than 2 seconds.  Neurological:     General: No focal deficit present.     Mental Status: He is alert. Mental status is at baseline.  Psychiatric:        Mood and Affect: Mood normal.        Behavior: Behavior normal.    ED Results / Procedures / Treatments   Labs (all labs ordered are listed, but only abnormal results are displayed) Labs Reviewed   URINALYSIS, ROUTINE W REFLEX MICROSCOPIC - Abnormal; Notable for the following components:      Result Value   APPearance HAZY (*)    pH 8.5 (*)    Hgb urine dipstick LARGE (*)    All other components within normal limits  CBC WITH DIFFERENTIAL/PLATELET - Abnormal; Notable for the following components:   WBC 11.5 (*)    MCHC 36.2 (*)    Neutro Abs 10.4 (*)    All other components within normal limits  COMPREHENSIVE METABOLIC PANEL - Abnormal; Notable for the following components:   Glucose, Bld 128 (*)    All other components within normal limits  URINALYSIS, MICROSCOPIC (REFLEX) - Abnormal; Notable for the following components:   Bacteria, UA RARE (*)    All other components within normal limits  URINE CULTURE    EKG None  Radiology CT Renal Stone Study  Result Date: 11/29/2021 CLINICAL DATA:  Left-sided flank pain.  History of kidney stones. EXAM: CT ABDOMEN AND PELVIS WITHOUT CONTRAST TECHNIQUE: Multidetector CT imaging of the abdomen and pelvis was performed following the standard protocol without IV contrast. RADIATION DOSE REDUCTION: This exam was performed according to the departmental dose-optimization program which includes automated exposure control, adjustment of the mA and/or kV according to patient size and/or use of iterative reconstruction technique. COMPARISON:  12/15/2003. FINDINGS: Lower chest: Unremarkable Hepatobiliary: No focal abnormality in the liver on this study without intravenous contrast. There is no evidence for gallstones, gallbladder wall thickening, or pericholecystic fluid. No intrahepatic or extrahepatic biliary dilation. Pancreas: No focal mass lesion. No dilatation of the main duct. No intraparenchymal cyst. No peripancreatic edema. Spleen: No splenomegaly. No focal mass lesion. Adrenals/Urinary Tract: No adrenal nodule or mass. 2.6 cm water density lesion posterior interpolar right kidney is compatible with a cyst. No stones are seen in the right  kidney or ureter. No secondary changes in the right kidney or ureter. No stones are seen in the left kidney with mild left hydroureteronephrosis. There is mild perinephric and periureteric edema. Left ureter is dilated down to the pelvis where a 4 x 5 x 4 mm stone is identified in the distal left ureter just proximal to the UVJ (image 73/2). No bladder stones. Stomach/Bowel: Stomach is unremarkable. No gastric wall thickening. No evidence of outlet obstruction. Duodenum is normally positioned as is the ligament of Treitz. No small bowel wall thickening. No small bowel dilatation. The terminal ileum is normal. The appendix is normal. No gross colonic mass. No colonic wall thickening. Vascular/Lymphatic: No abdominal aortic aneurysm. No abdominal aortic atherosclerotic calcification. There is no gastrohepatic or hepatoduodenal ligament lymphadenopathy. No retroperitoneal or mesenteric lymphadenopathy. No pelvic sidewall lymphadenopathy. Reproductive: The prostate  gland and seminal vesicles are unremarkable. Other: No intraperitoneal free fluid. Musculoskeletal: No worrisome lytic or sclerotic osseous abnormality. IMPRESSION: 1. 4 x 5 x 4 mm distal left ureteral stone with mild left hydroureteronephrosis. 2. 2.6 cm right renal cyst. Electronically Signed   By: Misty Stanley M.D.   On: 11/29/2021 13:09    Procedures Procedures    Medications Ordered in ED Medications  morphine (PF) 4 MG/ML injection 4 mg (4 mg Intravenous Given 11/29/21 1153)  ondansetron (ZOFRAN) injection 4 mg (4 mg Intravenous Given 11/29/21 1153)  ketorolac (TORADOL) 30 MG/ML injection 30 mg (30 mg Intravenous Given 11/29/21 1308)  sodium chloride 0.9 % bolus 1,000 mL (0 mLs Intravenous Stopped 11/29/21 1403)    ED Course/ Medical Decision Making/ A&P Clinical Course as of 11/29/21 1425  Sun Nov 29, 2021  1216 Unremarkable CMP, CBC with mild leukocytosis and ANC. Will await UA and CT renal stone study. [CM]  1249 Small stone noted on  CT around UPJ on my own interpretation, awaiting final read per radiology. Will treat with toradol and IV fluids as well as tamsulosin. Renal function WNL. [CM]  [id number] CT stone study with 4x5x37mm stone and hydroureteronephrosis. Will obtain pain control and send home with tamsulosin, pain management, zofran. [CM]    Clinical Course User Index [CM] Gladys Damme, MD                           Medical Decision Making 46 yo man presents with colicky flank pain, suprapubic pain, and hematuria on UA. Nephrolithiasis, diverticulitis, and intraparenchymal splenic pathology were considered. CT confirms 4-58mm stone at UPJ with hydrouretonephosis. Renal function WNL, pain well controlled with toradol and zofran. Treated with 1L NS bolus as well. Gave patient urology information, however with 4-58mm stone does not need urgent referral. Will rx flomax, pyridium, oxycodone for pain, and zofran for n/v for symptomatic treatment at home. Discussed return precautions. Patient's pain is well controlled, VSS, patient is stable for discharge at this time.  Amount and/or Complexity of Data Reviewed Independent Historian: spouse Labs: ordered. Decision-making details documented in ED Course. Radiology: ordered. Decision-making details documented in ED Course.  Risk Prescription drug management.          Final Clinical Impression(s) / ED Diagnoses Final diagnoses:  Kidney stone    Rx / DC Orders ED Discharge Orders          Ordered    ondansetron (ZOFRAN) 4 MG tablet  Every 8 hours PRN        11/29/21 1401    tamsulosin (FLOMAX) 0.4 MG CAPS capsule  Daily        11/29/21 1401    oxyCODONE-acetaminophen (PERCOCET/ROXICET) 5-325 MG tablet  Every 4 hours PRN        11/29/21 1401    phenazopyridine (PYRIDIUM) 200 MG tablet  3 times daily PRN        11/29/21 1401              Gladys Damme, MD 11/29/21 1425    Gareth Morgan, MD 12/01/21 [phone]

## 2021-11-29 NOTE — ED Triage Notes (Signed)
Pt reports L side flank pain. Hx of kidney stones, and feels like same

## 2021-11-29 NOTE — Discharge Instructions (Signed)
If your pain cannot be controlled at home, you cannot keep fluids down for 6-8 hours, or you cannot urinate, please come back to the emergency department for re-evaluation.  Please follow up with your PCP

## 2021-12-01 LAB — URINE CULTURE: Culture: NO GROWTH

## 2021-12-26 ENCOUNTER — Other Ambulatory Visit: Payer: Self-pay | Admitting: Family Medicine

## 2022-04-03 IMAGING — CT CT RENAL STONE PROTOCOL
2 of 4 series · 16 of 46 positions shown, 18 images · non-contrast
Comparison: 12/15/2003.

CLINICAL DATA: Left-sided flank pain.  History of kidney stones.



[Series 2: axial st · axial · 0.87mm/px · z∈[+988,+1413]mm · 13 of 93 slices shown, 15 images]
[im 4/93  soft-tissue]
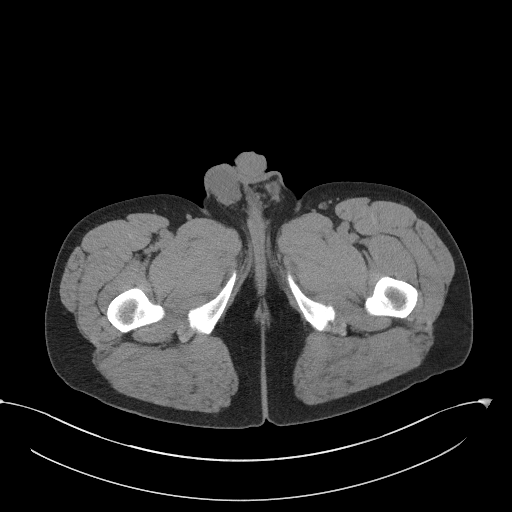
[im 4/93  bone]
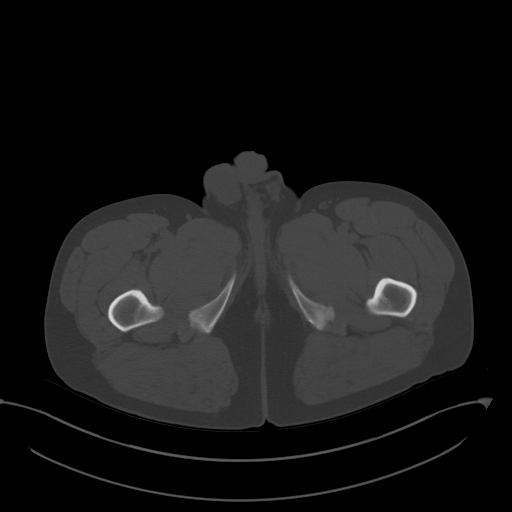
[im 11/93  soft-tissue]
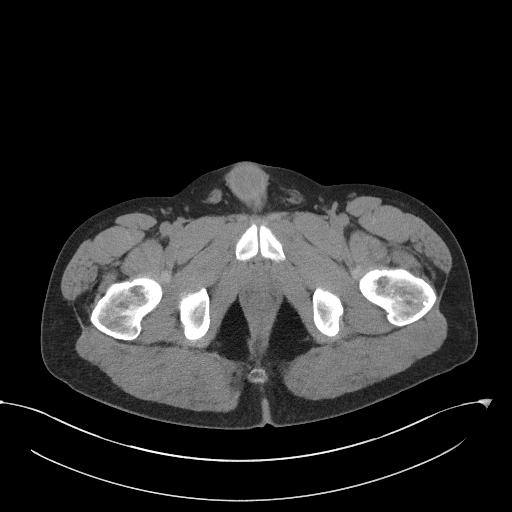
[im 18/93  soft-tissue]
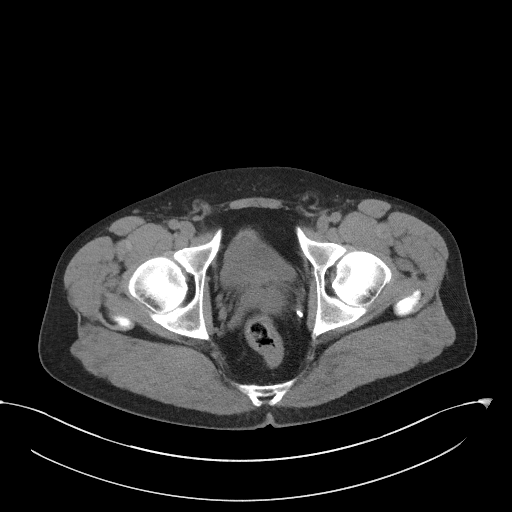
[im 25/93  soft-tissue]
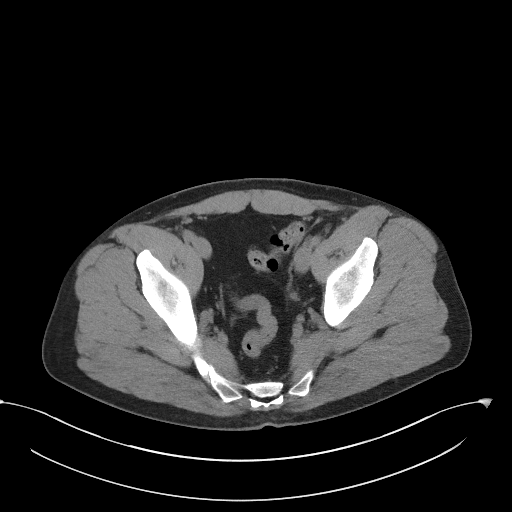
[im 32/93  soft-tissue]
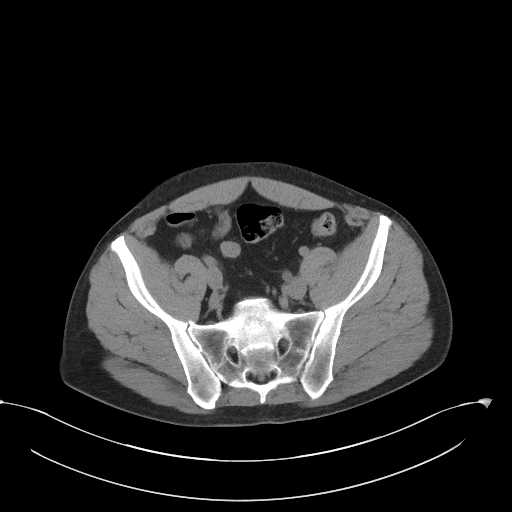
[im 39/93  soft-tissue]
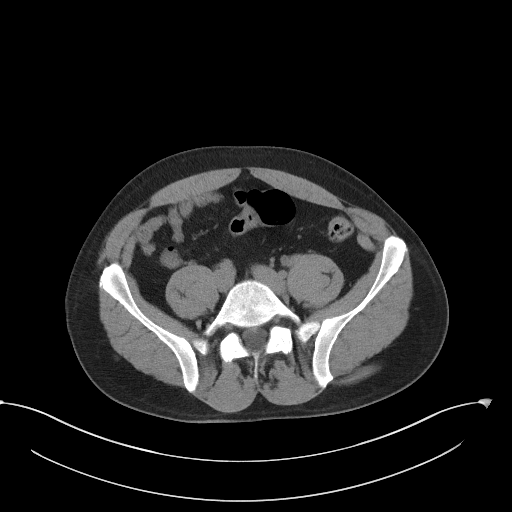
[im 47/93  soft-tissue]
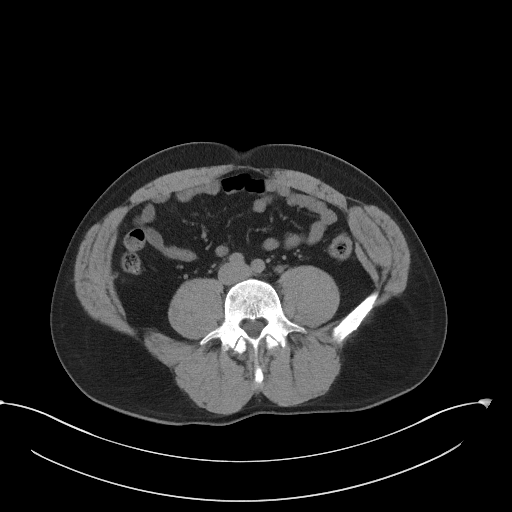
[im 54/93  soft-tissue]
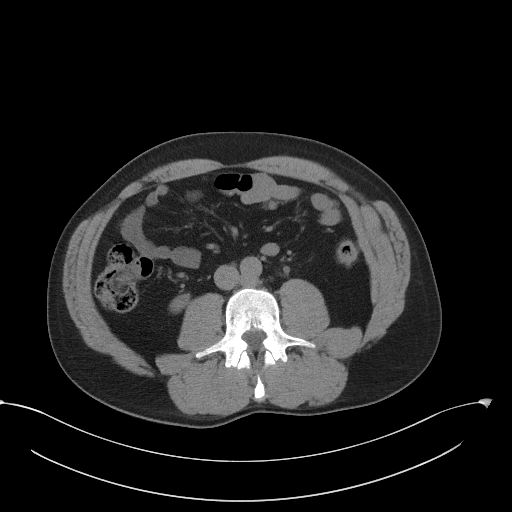
[im 61/93  soft-tissue]
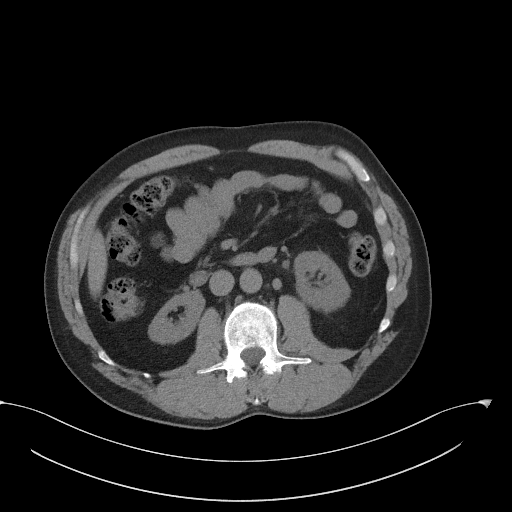
[im 61/93  bone]
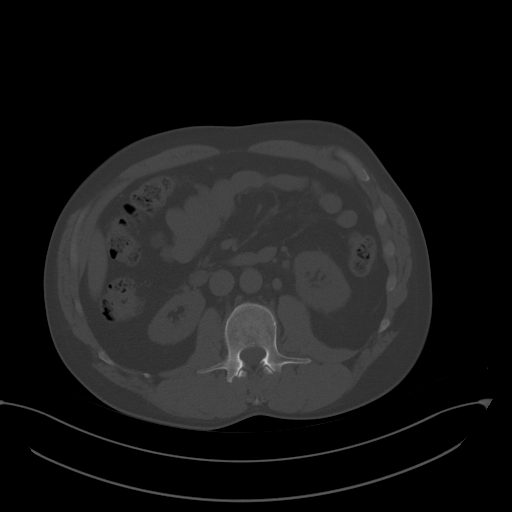
[im 68/93  soft-tissue]
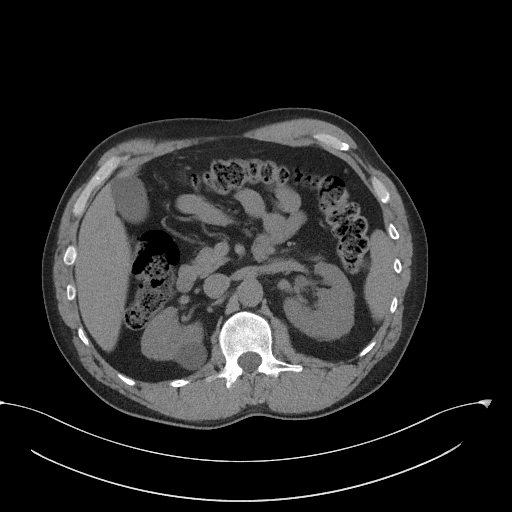
[im 75/93  soft-tissue]
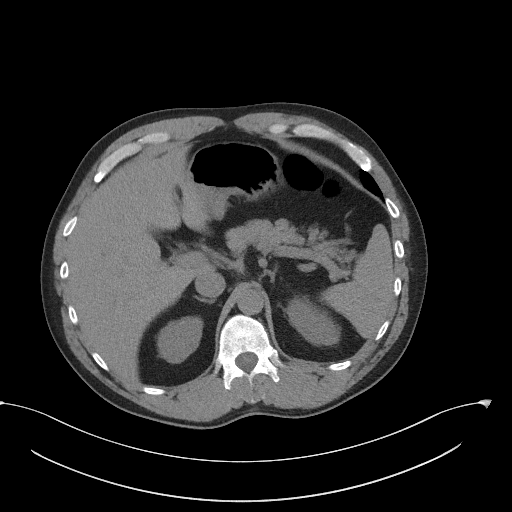
[im 82/93  soft-tissue]
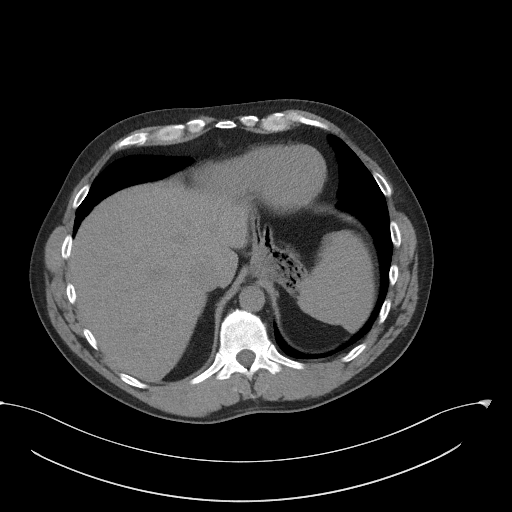
[im 89/93  soft-tissue]
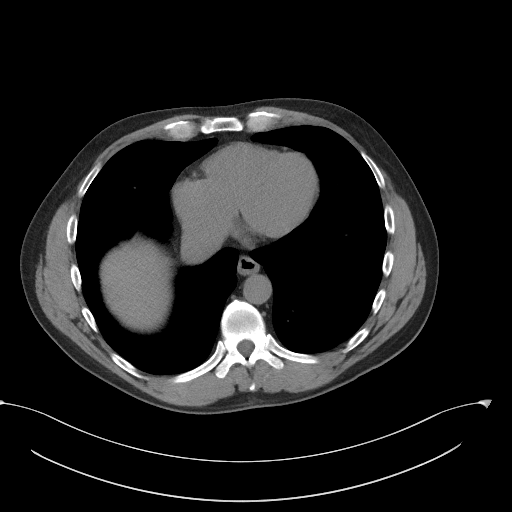

[Series 4: coronal st · coronal · 0.80mm/px · 3 of 99 slices shown]
[im 33/99  soft-tissue]
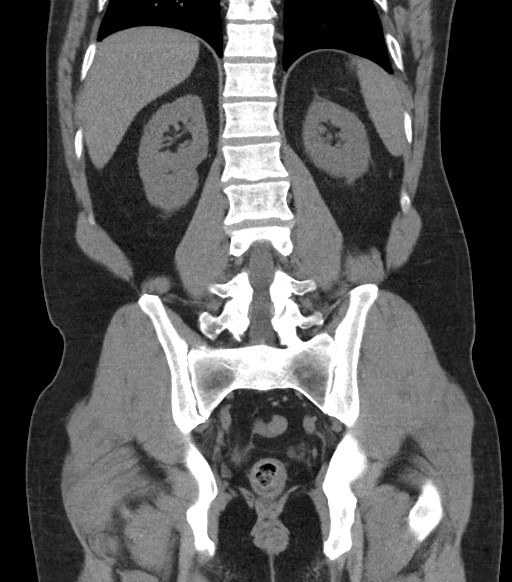
[im 44/99  soft-tissue]
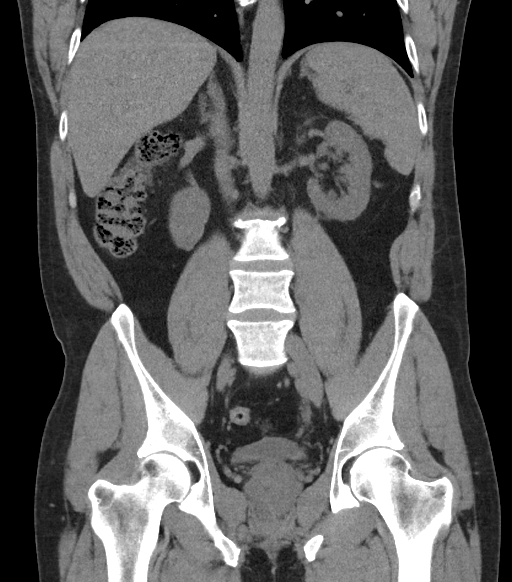
[im 55/99  soft-tissue]
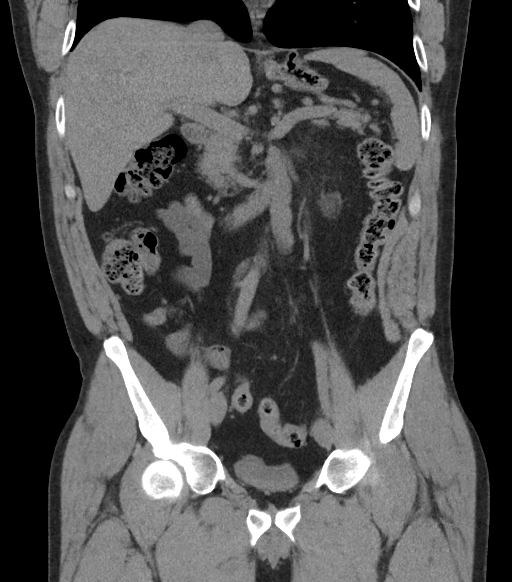

[16 of 46 positions shown; findings below may reference images not displayed]

FINDINGS: Lower chest: Unremarkable

Hepatobiliary: No focal abnormality in the liver on this study
without intravenous contrast. There is no evidence for gallstones,
gallbladder wall thickening, or pericholecystic fluid. No
intrahepatic or extrahepatic biliary dilation.

Pancreas: No focal mass lesion. No dilatation of the main duct. No
intraparenchymal cyst. No peripancreatic edema.

Spleen: No splenomegaly. No focal mass lesion.

Adrenals/Urinary Tract: No adrenal nodule or mass. 2.6 cm water
density lesion posterior interpolar right kidney is compatible with
a cyst. No stones are seen in the right kidney or ureter. No
secondary changes in the right kidney or ureter.

No stones are seen in the left kidney with mild left
hydroureteronephrosis. There is mild perinephric and periureteric
edema. Left ureter is dilated down to the pelvis where a 4 x 5 x 4
mm stone is identified in the distal left ureter just proximal to
the UVJ (image 73/2). No bladder stones.

Stomach/Bowel: Stomach is unremarkable. No gastric wall thickening.
No evidence of outlet obstruction. Duodenum is normally positioned
as is the ligament of Treitz. No small bowel wall thickening. No
small bowel dilatation. The terminal ileum is normal. The appendix
is normal. No gross colonic mass. No colonic wall thickening.

Vascular/Lymphatic: No abdominal aortic aneurysm. No abdominal
aortic atherosclerotic calcification. There is no gastrohepatic or
hepatoduodenal ligament lymphadenopathy. No retroperitoneal or
mesenteric lymphadenopathy. No pelvic sidewall lymphadenopathy.

Reproductive: The prostate gland and seminal vesicles are
unremarkable.

Other: No intraperitoneal free fluid.

Musculoskeletal: No worrisome lytic or sclerotic osseous
abnormality.
IMPRESSION: 1. 4 x 5 x 4 mm distal left ureteral stone with mild left
hydroureteronephrosis.
2. 2.6 cm right renal cyst.

## 2023-09-08 ENCOUNTER — Ambulatory Visit: Payer: BC Managed Care – PPO | Attending: Orthopedic Surgery

## 2023-09-08 ENCOUNTER — Other Ambulatory Visit: Payer: Self-pay

## 2023-09-08 DIAGNOSIS — M5412 Radiculopathy, cervical region: Secondary | ICD-10-CM | POA: Insufficient documentation

## 2023-09-08 NOTE — Therapy (Signed)
OUTPATIENT PHYSICAL THERAPY SHOULDER EVALUATION   Patient Name: Corey Rosario MRN: [id number] DOB:20-Sep-1976, 47 y.o., male Today's Date: 09/08/2023  END OF SESSION:  PT End of Session - 09/08/23 0849     Visit Number 1    Number of Visits 3    Date for PT Re-Evaluation 10/14/23    PT Start Time 0850    PT Stop Time 0920    PT Time Calculation (min) 30 min    Activity Tolerance Patient tolerated treatment well    Behavior During Therapy [facility] for tasks assessed/performed             Past Medical History:  Diagnosis Date   Kidney stone    History reviewed. No pertinent surgical history. 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 His right shoulder had been bothering for months before. He tried going back to the gym. He was able to do most of gym routine on Friday, but he had a twinge of pain when working his triceps. However, this went away quickly after he stopped. He has been having some intermittent  tingling in his left shoulder radiating down into digits 2-4.  Hand dominance: Right  PERTINENT HISTORY: Anxiety   PAIN:  Are you having pain? No  PRECAUTIONS: None  RED FLAGS: None   WEIGHT BEARING RESTRICTIONS: No  FALLS:  Has patient fallen in last 6 months? No  LIVING ENVIRONMENT: Lives with: lives with their family Lives in: House/apartment  OCCUPATION: Firefighter   PLOF: Independent  PATIENT GOALS: potential HEP  NEXT MD VISIT: unsure  OBJECTIVE:  Note: Objective measures were completed at Evaluation unless otherwise noted.  COGNITION: Overall cognitive status: Within functional limits for tasks  assessed     SENSATION: Patient reports intermittent tingling radiating down into his left hand.  CERVICAL ROM: WFL with no limitations  Active ROM A/PROM (deg) eval  Flexion Pulling along left cervical paraspinals  Extension   Right lateral flexion   Left lateral flexion   Right rotation   Left rotation    (Blank rows = not tested)   UPPER EXTREMITY ROM: WFL for activities assessed  UPPER EXTREMITY MMT:  MMT Right eval Left eval  Shoulder flexion 5/5 5/5  Shoulder extension    Shoulder abduction 4+/5 4+/5  Shoulder adduction    Shoulder internal rotation 4+/5; discomfort 5/5  Shoulder external rotation 4+/5; discomfort 5/5  Elbow flexion    Elbow extension    Wrist flexion    Wrist extension    Wrist ulnar deviation    Wrist radial deviation    Wrist pronation    Wrist supination    Grip strength  110 120  (Blank rows = not tested) CERVICAL SPECIAL TESTS:  Spurling's test: Negative and Sharp pursor's test: Negative   JOINT MOBILITY TESTING:  No limitation in cervical and thoracic spine   PALPATION:  TTP: left rhomboid and middle trapezius   TODAY'S TREATMENT:                                                                                                                                         DATE:                                     09/08/23 EXERCISE LOG  Exercise Repetitions and Resistance Comments  Self STM with lacrosse ball    Rhomboid stretch     Blank cell = exercise not performed today   PATIENT EDUCATION: Education details: plan of care, prognosis, objective findings, healing, safety returning to the gym, benefits of a warm-up prior to lifting, and proper posture for computer  Person educated: Patient Education method: Explanation Education comprehension: verbalized understanding  HOME EXERCISE PROGRAM: Today's interventions were added to his HEP. He was able able to properly demonstrate these interventions and he felt comfortable performing these at home.   ASSESSMENT:  CLINICAL IMPRESSION: Patient is a 47 y.o. male who was seen today for physical therapy evaluation and treatment for intermittent cervical and upper extremity symptoms. He presented with low pain severity and irritability with resisted internal and external rotation slightly reproducing his familiar discomfort. He was provided a home exercise program which he was able to properly demonstrate. He reported feeling comfortable with these interventions. He will be placed on hold at this time to determine the effectiveness of this home exercise program.    OBJECTIVE IMPAIRMENTS: impaired tone and pain.   ACTIVITY LIMITATIONS: lifting  PARTICIPATION LIMITATIONS: community activity  PERSONAL FACTORS: Time since onset of injury/illness/exacerbation and 1 comorbidity:  anxiety  are also affecting patient's functional outcome.   REHAB POTENTIAL: Excellent  CLINICAL DECISION MAKING: Stable/uncomplicated  EVALUATION COMPLEXITY: Low   GOALS: Goals reviewed with patient? Yes  LONG TERM GOALS: Target date: 09/29/23  Patient will be independent with his HEP.  Baseline:  Goal status: INITIAL  2.  Patient will be able to complete his daily activities without reproducing his familiar left upper extremity tingling.  Baseline:  Goal  status: INITIAL  3.  Patient will be able to recall safe return to the gym for reduced risk of re injury.   Baseline:  Goal status: INITIAL  PLAN:  PT FREQUENCY: 1-2x/week  PT DURATION: 3 weeks  PLANNED INTERVENTIONS: 97164- PT Re-evaluation, 97110-Therapeutic exercises, 97530- Therapeutic activity, 97112- Neuromuscular re-education, 97535- Self Care, 16109- Manual therapy, 97014- Electrical stimulation (unattended), 97016- Vasopneumatic device, [id number]- Traction (mechanical), Patient/Family education, Taping, Dry Needling, Joint mobilization, Spinal manipulation, Spinal mobilization, Cryotherapy, and Moist heat  PLAN FOR NEXT SESSION: review and update HEP as needed   Granville Lewis, PT 09/08/2023, 5:22 PM

## 2024-05-01 ENCOUNTER — Ambulatory Visit: Admitting: Podiatry

## 2024-05-01 VITALS — Ht 69.0 in | Wt 185.0 lb

## 2024-05-01 DIAGNOSIS — B351 Tinea unguium: Secondary | ICD-10-CM | POA: Diagnosis not present

## 2024-05-01 MED ORDER — TERBINAFINE HCL 250 MG PO TABS: 250.0000 mg | ORAL_TABLET | Freq: Every day | ORAL | 0 refills | Status: AC

## 2024-05-01 MED ORDER — EFINACONAZOLE 10 % EX SOLN: 1.0000 [drp] | Freq: Every day | CUTANEOUS | 11 refills | Status: AC

## 2024-05-01 NOTE — Progress Notes (Signed)
  Subjective:  Patient ID: Corey Rosario, male    DOB: [birth year],  MRN: [id number]  Chief Complaint  Patient presents with   Nail Problem    Rm 3 Patient is here for nail trauma or possible onychomycosis of the right and left hallux and right index toe.    48 y.o. male presents with the above complaint. History confirmed with patient.  Presents for follow-up previously saw Dr. Gretel for this has a history of nail damage but now is affecting other toenails as well  Objective:  Physical Exam: warm, good capillary refill, no trophic changes or ulcerative lesions, normal DP and PT pulses, normal sensory exam, onychomycosis, and dystrophic changes of bilateral hallux right 2nd and 3rd toenails.      Assessment:   1. Onychomycosis      Plan:  Patient was evaluated and treated and all questions answered.  Onychomycosis -Educated on etiology of nail fungus. -Discussed oral topical therapy and risks and benefits of each -eRx for oral terbinafine  #90. Educated on risks and benefits of the medication.  Rx Lamisil  for dual therapy sent to specialty pharmacy -Photographs taken -We discussed long-term if not improving or worsening could consider permanent matricectomy of the affected nails as needed   Return in about 4 months (around 09/01/2024) for follow up after nail fungus treatment.

## 2024-09-03 ENCOUNTER — Ambulatory Visit: Admitting: Podiatry

## 2024-09-18 ENCOUNTER — Ambulatory Visit: Admitting: Podiatry

## 2024-09-18 DIAGNOSIS — B351 Tinea unguium: Secondary | ICD-10-CM | POA: Diagnosis not present

## 2024-09-18 NOTE — Progress Notes (Signed)
  Subjective:  Patient ID: Corey Rosario, male    DOB: 1976/07/21,  MRN: [id number]  Chief Complaint  Patient presents with   Nail Problem    Pt is here for a follow up on nail fungus he stated that he feels like it has helped a little bit but its not completely gone     48 y.o. male presents with the above complaint. History confirmed with patient.  He has completed the terbinafine , has been using the Jublia  as well has not noticed much improvement thus far.  Objective:  Physical Exam: warm, good capillary refill, no trophic changes or ulcerative lesions, normal DP and PT pulses, normal sensory exam, onychomycosis, and dystrophic changes of bilateral hallux right 2nd and 3rd toenails.      Assessment:   1. Onychomycosis      Plan:  Patient was evaluated and treated and all questions answered.  Onychomycosis So far not tons of improvement he is still using the Jublia ,, has completed the Lamisil , had some elevation of his liver enzymes and his PCP is going to recheck this in 6 weeks, considering this I would not recommend repeating the course of Lamisil , he may continue using the Jublia .  I discussed temporary nail removal likely will not offer an improved result, long-term permanent matricectomy would be a permanent solution.  So far, it is not particularly painful or bothersome for him to necessitate this.  I will see him back in 5 months after further Jublia  use and nail growth, discussed with there is not much change at that point that I do not think further treatment will be needed or beneficial.   Return in about 5 months (around 02/16/2025) for follow up after nail fungus treatment.

## 2025-02-19 ENCOUNTER — Ambulatory Visit: Admitting: Podiatry
